# Patient Record
Sex: Male | Born: 1995 | Race: White | Hispanic: No | Marital: Single | Smoking: Current every day smoker
Health system: Southern US, Community
[De-identification: ages and names within clinical notes are randomized; demographics above are authoritative.]

## PROBLEM LIST (undated history)

## (undated) DIAGNOSIS — F319 Bipolar disorder, unspecified: Secondary | ICD-10-CM

## (undated) DIAGNOSIS — K219 Gastro-esophageal reflux disease without esophagitis: Secondary | ICD-10-CM

## (undated) HISTORY — PX: TYMPANOSTOMY TUBE PLACEMENT: SHX32

---
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
## 2014-12-17 ENCOUNTER — Emergency Department (HOSPITAL_COMMUNITY)
Admission: EM | Admit: 2014-12-17 | Discharge: 2014-12-17 | Disposition: A | Discharge status: Home / Self Care | Payer: No Typology Code available for payment source | Attending: Emergency Medicine | Admitting: Emergency Medicine

## 2014-12-17 ENCOUNTER — Encounter (HOSPITAL_COMMUNITY): Payer: Self-pay

## 2014-12-17 DIAGNOSIS — Y92410 Unspecified street and highway as the place of occurrence of the external cause: Secondary | ICD-10-CM | POA: Diagnosis not present

## 2014-12-17 DIAGNOSIS — S3992XA Unspecified injury of lower back, initial encounter: Secondary | ICD-10-CM | POA: Insufficient documentation

## 2014-12-17 DIAGNOSIS — S0993XA Unspecified injury of face, initial encounter: Secondary | ICD-10-CM | POA: Insufficient documentation

## 2014-12-17 DIAGNOSIS — S0990XA Unspecified injury of head, initial encounter: Secondary | ICD-10-CM | POA: Insufficient documentation

## 2014-12-17 DIAGNOSIS — Y9389 Activity, other specified: Secondary | ICD-10-CM | POA: Insufficient documentation

## 2014-12-17 DIAGNOSIS — S0991XA Unspecified injury of ear, initial encounter: Secondary | ICD-10-CM | POA: Insufficient documentation

## 2014-12-17 DIAGNOSIS — M7918 Myalgia, other site: Secondary | ICD-10-CM

## 2014-12-17 DIAGNOSIS — Y998 Other external cause status: Secondary | ICD-10-CM | POA: Diagnosis not present

## 2014-12-17 DIAGNOSIS — Z720 Tobacco use: Secondary | ICD-10-CM | POA: Diagnosis not present

## 2014-12-17 DIAGNOSIS — Z8659 Personal history of other mental and behavioral disorders: Secondary | ICD-10-CM | POA: Insufficient documentation

## 2014-12-17 DIAGNOSIS — S199XXA Unspecified injury of neck, initial encounter: Secondary | ICD-10-CM | POA: Insufficient documentation

## 2014-12-17 DIAGNOSIS — Z8719 Personal history of other diseases of the digestive system: Secondary | ICD-10-CM | POA: Insufficient documentation

## 2014-12-17 MED ORDER — NAPROXEN 500 MG PO TABS
500.0000 mg | ORAL_TABLET | Freq: Once | ORAL | Status: AC
  Administered 2014-12-17: 500 mg via ORAL
  Filled 2014-12-17: qty 1

## 2014-12-17 MED ORDER — METHOCARBAMOL 500 MG PO TABS: 500.0000 mg | ORAL_TABLET | Freq: Two times a day (BID) | ORAL | Status: AC

## 2014-12-17 MED ORDER — NAPROXEN 500 MG PO TABS: 500.0000 mg | ORAL_TABLET | Freq: Two times a day (BID) | ORAL | Status: AC

## 2014-12-17 MED ORDER — CYCLOBENZAPRINE HCL 10 MG PO TABS
5.0000 mg | ORAL_TABLET | Freq: Once | ORAL | Status: AC
  Administered 2014-12-17: 5 mg via ORAL
  Filled 2014-12-17: qty 1
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
